# Patient Record
Sex: Female | Born: 1935 | Race: White | Hispanic: No | Smoking: Never smoker
Health system: Southern US, Community
[De-identification: ages and names within clinical notes are randomized; demographics above are authoritative.]

## PROBLEM LIST (undated history)

## (undated) DIAGNOSIS — I10 Essential (primary) hypertension: Secondary | ICD-10-CM

## (undated) DIAGNOSIS — J189 Pneumonia, unspecified organism: Secondary | ICD-10-CM

## (undated) HISTORY — PX: REPLACEMENT TOTAL KNEE: SUR1224

## (undated) HISTORY — PX: VAGINAL HYSTERECTOMY: SUR661

## (undated) HISTORY — DX: Essential (primary) hypertension: I10

## (undated) HISTORY — DX: Pneumonia, unspecified organism: J18.9

---

## 2005-03-22 ENCOUNTER — Ambulatory Visit: Payer: Self-pay | Admitting: Family Medicine

## 2018-10-10 ENCOUNTER — Institutional Professional Consult (permissible substitution): Payer: Self-pay | Admitting: Pulmonary Disease

## 2018-10-18 ENCOUNTER — Encounter: Payer: Self-pay | Admitting: Pulmonary Disease

## 2018-10-18 ENCOUNTER — Ambulatory Visit (INDEPENDENT_AMBULATORY_CARE_PROVIDER_SITE_OTHER): Payer: Medicare Other | Admitting: Pulmonary Disease

## 2018-10-18 ENCOUNTER — Other Ambulatory Visit: Payer: Self-pay

## 2018-10-18 VITALS — BP 136/84 | HR 81 | Ht 64.0 in | Wt 149.8 lb

## 2018-10-18 DIAGNOSIS — R911 Solitary pulmonary nodule: Secondary | ICD-10-CM | POA: Diagnosis not present

## 2018-10-18 MED ORDER — ALBUTEROL SULFATE HFA 108 (90 BASE) MCG/ACT IN AERS: 2.0000 | INHALATION_SPRAY | Freq: Four times a day (QID) | RESPIRATORY_TRACT | 2 refills | Status: DC | PRN

## 2018-10-18 NOTE — Progress Notes (Signed)
Subjective:   PATIENT ID: Gail [name] GENDER: female DOB: 1932/05/02, MRN: [id number]   HPI  Chief Complaint  Patient presents with  . Consult    lung nodule found when diagnosed with pneumonia Sep 22, 2018    Reason for Visit: New consult for lung nodule  Mr. Gail [name] is a 85 year old female never smoker with HTN, stage IV CKD, paroxysmal atrial fibrillation, B12 deficiency who presents as new consult for lung nodule.  A few weeks ago she presented to Urgent care, she was diagnosed with pneumonia after having symptoms of weakness, dizziness and shortness of breath. CXR showed pneumonia. Follow-up CT scan was obtained and demonstrated incidental lung nodule. She does not have any imaging or reports with her. She does not know which side the nodule is. Aside from her recent illness, which she has recovered from she denies any symptoms of fevers, chills, weight loss, night sweats, chest pain.   She has chronic nonproductive cough at night. Reports shortness of breath improved after treatment with antibiotics. She denies lower extremity swelling.  Of note, she is planned for cataract surgery and would like to know if this lung nodule will postpone her procedure.  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   I have personally reviewed patient's past medical/family/social history, allergies, current medications.  Past Medical History:  Diagnosis Date  . Hypertension   . Pneumonia      Family History  Problem Relation Age of Onset  . Kidney cancer Mother   . Cancer Father   . Cancer Sister   . Colon cancer Sister   . Cancer Sister      Social History   Occupational History  . Occupation: retired     Comment: Radiation protection practitioner work   Tobacco Use  . Smoking status: Never Smoker  . Smokeless tobacco: Never Used  Substance and Sexual Activity  . Alcohol use: Never    Frequency:  Never  . Drug use: Never  . Sexual activity: Not on file    Allergies  Allergen Reactions  . Codeine     Other reaction(s): GI Upset (intolerance)     Outpatient Medications Prior to Visit  Medication Sig Dispense Refill  . amLODipine (NORVASC) 10 MG tablet TAKE 1 TABLET(10 MG) BY MOUTH DAILY    . Multiple Vitamins-Minerals (MULTIVITAMIN ADULT PO) Take by mouth.     No facility-administered medications prior to visit.     Review of Systems  Constitutional: Positive for malaise/fatigue. Negative for chills, diaphoresis, fever and weight loss.  HENT: Negative for congestion, ear pain and sore throat.   Respiratory: Positive for shortness of breath. Negative for cough, hemoptysis, sputum production and wheezing.   Cardiovascular: Negative for chest pain, palpitations and leg swelling.  Gastrointestinal: Negative for abdominal pain, heartburn and nausea.  Genitourinary: Negative for frequency.  Musculoskeletal: Positive for joint pain. Negative for myalgias.  Skin: Negative for itching and rash.  Neurological: Positive for dizziness and weakness. Negative for headaches.  Endo/Heme/Allergies: Does not bruise/bleed easily.  Psychiatric/Behavioral: Negative for depression. The patient is not nervous/anxious.      Objective:   Vitals:   10/18/18 1620 10/18/18 1622  BP: 136/84   Pulse:  81  SpO2:  92%  Weight: 149 lb 12.8 oz (67.9 kg)   Height: 5\' 4"  (1.626 m)    SpO2: 92 %  Physical Exam: General: Thin, elderly-appearing female, no acute distress  HENT: [place], AT, OP clear, MMM Eyes: EOMI, no scleral icterus Respiratory: Clear to auscultation bilaterally.  No crackles, wheezing or rales Cardiovascular: RRR, -M/R/G, no JVD GI: BS+, soft, nontender Extremities:-Edema,-tenderness Neuro: AAO x4, CNII-XII grossly intact Skin: Intact, no rashes or bruising Psych: Normal mood, normal affect  Data Reviewed:  Imaging: None on file or CareEverywhere  PFT: None on file   Labs: OSH Labs [facility] 09/22/18 Na 139 K 3.6 Cl 100 CO2 30 BUN 29 Glucose 116 Ca 8.6 T Protein 6.2 T. Bili 0.4 AP 113 AST 62 ALT 38  TTE 06/04/24  EF 36%, diastolic dysfunction, severe TR with RVSP 63 mmHg  Imaging, labs and tests noted above have been reviewed independently by me.    Assessment & Plan:  1. Lung nodule  Discussion: 85 year old female never smoker with HTN, stage IV CKD, paroxysmal atrial fibrillation, B12 deficiency who presents as new consult for lung nodule. No imaging available at time of visit to review so unknown the size or character of the nodule. Will arrange for repeat CT scan and request of prior imaging to compare. Her respiratory symptoms are mild and do not limit her daily activity. Echo noted to have elevated RVSP, otherwise no evidence of right heart failure. Will trial SABA to see if this improves. Will discuss additional work-up of her dyspnea at future visit.  OK to proceed cataract surgery  Will plan for CT chest without contrast in 3 months Will obtain Randolf CT scan. If earlier intervention is needed, we will contact you Will prescribe albuterol inhaler to use as needed for shortness of breath or wheezing   Health Maintenance Prevnar 12/22/1998 Influenza 03/09/18  Orders Placed This Encounter  Procedures  . CT Chest Wo Contrast    Standing Status:   Future    Standing Expiration Date:   12/18/2019    Scheduling Instructions:     Please schedule in 3 months    Order Specific Question:   ** REASON FOR EXAM (FREE TEXT)    Answer:   lung nodule    Order Specific Question:   Preferred imaging location?    Answer:   [facility]    Order Specific Question:   Radiology Contrast Protocol - do NOT remove file path    Answer:   [email or web address]   Meds ordered this encounter  Medications  . albuterol (PROVENTIL HFA;VENTOLIN HFA) 108 (90 Base) MCG/ACT inhaler    Sig: Inhale 2 puffs into the lungs every 6  (six) hours as needed for wheezing or shortness of breath.    Dispense:  1 Inhaler    Refill:  2    Return in about 3 months (around 01/18/2019).  [place], MD [facility] 10/27/2018 3:47 PM  Office Number [phone]

## 2018-10-18 NOTE — Patient Instructions (Signed)
OK to proceed cataract surgery  Will plan for CT chest without contrast in 3 months Will obtain Randolf CT scan. If earlier intervention is needed, we will contact you Will prescribe albuterol inhaler to use as needed for shortness of breath or wheezing

## 2018-10-19 ENCOUNTER — Encounter: Payer: Self-pay | Admitting: Pulmonary Disease

## 2018-10-23 ENCOUNTER — Telehealth: Payer: Self-pay | Admitting: Pulmonary Disease

## 2018-10-23 NOTE — Telephone Encounter (Signed)
ROI Faxed to [facility] 3/16/20fbg

## 2018-10-24 NOTE — Telephone Encounter (Signed)
I do not see anything in Ellison's mailbox or on her desk.

## 2018-10-24 NOTE — Telephone Encounter (Signed)
Dr. Loanne Drilling and Langley Gauss, please advise if you have received the disk. Thank you.

## 2018-10-25 NOTE — Telephone Encounter (Signed)
The CT disk is here and I am placing it on Dr. Cordelia Pen desk today.

## 2018-10-27 DIAGNOSIS — R911 Solitary pulmonary nodule: Secondary | ICD-10-CM | POA: Insufficient documentation

## 2018-10-27 DIAGNOSIS — I480 Paroxysmal atrial fibrillation: Secondary | ICD-10-CM | POA: Insufficient documentation

## 2018-10-27 DIAGNOSIS — R918 Other nonspecific abnormal finding of lung field: Secondary | ICD-10-CM | POA: Insufficient documentation

## 2018-10-27 DIAGNOSIS — N184 Chronic kidney disease, stage 4 (severe): Secondary | ICD-10-CM | POA: Insufficient documentation

## 2018-10-27 DIAGNOSIS — I10 Essential (primary) hypertension: Secondary | ICD-10-CM | POA: Insufficient documentation

## 2018-10-27 NOTE — Telephone Encounter (Signed)
Will review on Monday.

## 2018-11-14 ENCOUNTER — Telehealth: Payer: Self-pay | Admitting: Pulmonary Disease

## 2018-11-15 NOTE — Telephone Encounter (Signed)
Please see the information below. Sent to you as a FYI. Once the fax comes in, it should be placed in Dr. Cordelia Pen in box.

## 2018-11-16 NOTE — Telephone Encounter (Signed)
Checked Dr. Loanne Drilling in box up front, this fax has not come through yet.

## 2018-11-20 NOTE — Telephone Encounter (Signed)
No report in Dr. Cordelia Pen box as of 11/20/18 a.m.

## 2018-11-20 NOTE — Telephone Encounter (Signed)
Langley Gauss - please advise if you have this CT report. Thanks!

## 2018-11-22 NOTE — Telephone Encounter (Signed)
Spoke with receptionist at [facility]. She is going to refax this CT report to [place]. Will await fax.

## 2018-11-24 NOTE — Telephone Encounter (Signed)
Langley Gauss, please advise if you have now received the fax from [facility]. Thanks!

## 2018-11-27 NOTE — Telephone Encounter (Signed)
Patient has inquired about CT scan results.  She wanted Dr. Loanne Drilling to review and relay her findings.  Patient has also requested the written report be sent to [place] but no copy of this report has been noted.    Dr. Loanne Drilling can you please advise your interpretation of this patient's scan so we may update her?   Thanks

## 2018-11-27 NOTE — Telephone Encounter (Signed)
Still no fax in Gail [name]'s box but she did pick up the CD of scan a few weeks ago so I will message her on the 10/23/18 note to get a result from her on the scan.  Then we can update the patient of her interpretation.

## 2018-11-30 NOTE — Telephone Encounter (Signed)
Spoke with Dr. Loanne Drilling by phone 11/29/18 and she plans to review scan and give [place] a response for the patient.  A call was made to [facility] to get copy of CT report also since it has not been located.

## 2018-12-01 NOTE — Telephone Encounter (Signed)
Received written report of CT scan. Placed copy on Dr. Cordelia Pen desk for review and a copy was sent to scan file.  Nothing further needed today.

## 2018-12-04 ENCOUNTER — Telehealth: Payer: Self-pay | Admitting: Pulmonary Disease

## 2018-12-04 DIAGNOSIS — R911 Solitary pulmonary nodule: Secondary | ICD-10-CM

## 2018-12-04 NOTE — Telephone Encounter (Signed)
Received a call from Dr. Loanne Drilling wanting to know the report from the CT pt had performed 09/28/2018.  Impression: 1. Multiple bilateral pulmonary nodules suggesting metastatic disease 2. Patchy airspace opacities in the basilar segments of both lower lobes and in the inferior right middle lobe, suspect infectious/inflammatory etiology 3. Coronary and Aortic Atherosclerosis  Per Dr. Loanne Drilling, pt needs to have PET performed to further evaluate the results of the CT. Dr. Loanne Drilling also asked if I could call pt to let her know what was needed to be done and I stated to Dr. Loanne Drilling that I would call pt.  Called and spoke with pt letting her know that Dr. Loanne Drilling tried to upload the images from the CD but she was not able to do so but we did receive the written report from the  CT scan and stated to her based on the written report, Dr. Loanne Drilling was wanting her to have a PET scan performed ASAP to further evaluate the CT. Pt expressed understanding and asked me to contact her daughter Thayer Headings [facility]) due to pt not driving and her daughter would be the one who would be bringing her to get the scan performed.  Attempted to contact pt's daughter Thayer Headings letting her know this information but unable to reach her. Left a detailed message for Thayer Headings and stated in the message for her to return my call. Order has been pended. Will await a return call from [place] and then will place the order.

## 2018-12-04 NOTE — Telephone Encounter (Signed)
Patient's daughter Thayer Headings did return my call and I stated to her the info that I had told pt. Per Thayer Headings, please try to get PET scheduled Tuesday, Wednesday, or Friday as she is off these days. I stated to Thayer Headings that I would place that in the scheduling instructions. Stated to her that our PCCs will be contacting her soon to get PET scheduled for pt. Thayer Headings expressed understanding. Nothing further needed.

## 2018-12-04 NOTE — Telephone Encounter (Signed)
Unable to review scan due to incompatibility of CD on several computers. Fax received on 12/01/18. Report reviewed with staff with radiology impression concerning for "multiple pulmonary nodules suggesting metastatic disease." Though I can not personally review imaging, I believe patient warrants PET asap for further evaluation.

## 2018-12-06 ENCOUNTER — Telehealth: Payer: Self-pay | Admitting: Pulmonary Disease

## 2018-12-06 NOTE — Telephone Encounter (Signed)
Spoke with Judeen Hammans about message:  Returned call to patient's daughter. She would like PET cancelled at [facility] for tomorrow 5/1. She would like it rescheduled in [place]. Explained per Judeen Hammans we are not sure how long it will take to get re- scheduled. They are ok with that as the patient will be more comfortable being closer to home.  Routing to [facility] pool.

## 2018-12-06 NOTE — Telephone Encounter (Signed)
Spoke to [place] and she is going to work on this.

## 2018-12-06 NOTE — Telephone Encounter (Signed)
PET Scan at [facility] has [name] cancelled because the Patient wanted to have the PET scan done at [facility]. I have it scheduled for 12/20/2018 @ 9:45am. Daughter Gail [name] is aware of the appt and location. Referral order has [name] faxed to [facility]

## 2018-12-08 ENCOUNTER — Ambulatory Visit (HOSPITAL_COMMUNITY): Payer: Medicare Other

## 2018-12-18 ENCOUNTER — Telehealth: Payer: Self-pay | Admitting: Pulmonary Disease

## 2018-12-18 NOTE — Telephone Encounter (Signed)
12/18/2018 1518  Contacted by [facility] today to review patient's upcoming order for CT chest, per COVID-19 protocol.  Will cancel current order for CT chest as patient has PET scan scheduled on 12/20/2018.  It appears per chart review that CT chest order was initially placed prior to getting previous CT results.  Previous CT results prompted PET scan order.  Patient proceed forward with PET scan as planned and scheduled on 12/20/2018.  Can consider repeat CT chest after PET scan if clinically indicated.  Will route note to Dr. Loanne Drilling as Juluis Rainier.  Wyn Quaker, FNP

## 2018-12-20 ENCOUNTER — Encounter: Payer: Self-pay | Admitting: Pulmonary Disease

## 2018-12-27 ENCOUNTER — Telehealth: Payer: Self-pay | Admitting: Pulmonary Disease

## 2018-12-27 DIAGNOSIS — R911 Solitary pulmonary nodule: Secondary | ICD-10-CM

## 2018-12-27 NOTE — Telephone Encounter (Signed)
Called spoke with Thayer Headings patient daughter. Per previous encounter it appear Dr. Loanne Drilling attempted to contact patient with results documented she wished to be called back on 5/21. Closing this encounter. Sending message to JE to call patient daughter.

## 2018-12-27 NOTE — Telephone Encounter (Signed)
[facility]  Contacted by staff regarding PET/CT 12/20/18 report received via fax today (12/27/18).  Personally reviewed report (imaging not available):  Findings summarized Largest pulmonary nodules in the left lower lobe deep to the heart measuring 2.7 cm with SUV max equal 3.7. This is relatively mild to moderate activity for lesions this large. Adjacent 8 mm left lobe nodules has faint metabolic activity.  Nodule in the left upper lobe for example measuring 21JH has low metabolic activity SUV max equal 1.25. There are scattered nodules in the right lung with faint or no metabolic activity.  No hypermetabolic mediastinal lympho nodes. Incidental gallstones  Impression: 1. Bilateral pulmonary nodules with relatively low metabolic activity remain indeterminate. The relatively low metabolic activity is not typical of bronchogenic carcinoma or pulmonary metastasis. Largest nodule in the LEFT lower lobe would be difficult to biopsy. Some of the smaller nodules may be assessable for tissue sampling. 2. No mediastinal lymphadenopathy. No evidence of distant metastatic disease. 3. Focal uptake at the ileocecal valve is favored physiologic   I contacted patient regarding test results. Before discussion began, she requested to be called tomorrow due to needing to use the restroom.  Plan to call patient on 5/21.  Rodman Pickle, M.D. [facility] Pulmonary/Critical Care Medicine Pager: [phone] After hours pager: [phone]

## 2018-12-28 NOTE — Telephone Encounter (Signed)
[facility] Telephone Encounter  I contacted [name] and her daughter, Gail [name], via telephone.  We reviewed PET/CT report and discussed the differential diagnosis of her pulmonary nodules including infection, inflammation and malignancy. My suspicion for malignancy is low however I cannot rule it out. We discussed the risks and benefits of continued surveillance vs invasive testing. We also discussed if patient would want to be aggressive with treatment if this did turn out to be malignancy. After shared decision making, [name] wished to remain conservative and continue ongoing surveillance.   Assessment: Bilateral pulmonary nodules PET-avid lung nodules, low metabolic activity  CT Chest without contrast ordered for November 2020 Will arrange for televisit in November 2020 after imaging obtained  Rodman Pickle, M.D. [facility]monary/Critical Care Medicine Pager: [phone] After hours pager: [phone]

## 2018-12-28 NOTE — Addendum Note (Signed)
Addended by: Rodman Pickle on: 12/28/2018 04:48 PM   Modules accepted: Orders

## 2019-01-17 ENCOUNTER — Telehealth: Payer: Self-pay

## 2019-01-17 NOTE — Telephone Encounter (Signed)
Left message with daughter Thayer Headings to see if they needed the televisit tomorrow.  Dr Loanne Drilling in her last note stated to f/u in November with a CT scan.  This f/u was scheduled in March 2020.  JE has spoken with them after this date.  Left it up to pt.  Stated they are more than welcome to have the televisit but wanted to give them the option.

## 2019-01-18 ENCOUNTER — Ambulatory Visit: Payer: Medicare Other | Admitting: Pulmonary Disease

## 2019-01-18 ENCOUNTER — Ambulatory Visit: Payer: Medicare Other | Admitting: Primary Care

## 2019-01-18 ENCOUNTER — Other Ambulatory Visit: Payer: Self-pay

## 2019-01-18 NOTE — Progress Notes (Signed)
Canceled.

## 2019-06-06 ENCOUNTER — Other Ambulatory Visit: Payer: Self-pay | Admitting: *Deleted

## 2019-06-06 ENCOUNTER — Telehealth: Payer: Self-pay | Admitting: *Deleted

## 2019-06-06 DIAGNOSIS — R911 Solitary pulmonary nodule: Secondary | ICD-10-CM

## 2019-06-06 NOTE — Telephone Encounter (Signed)
Called patient made her aware we cancel appointment for CT Chest WO due to insurance. Patient ask to call daughter I called daughter and left message for her to call back. [phone]. Sent message to office CT appt was CX due to insurance will need to be rescheduled at different facility and needs new order.

## 2019-06-13 ENCOUNTER — Ambulatory Visit
Admission: RE | Admit: 2019-06-13 | Discharge: 2019-06-13 | Disposition: A | Discharge status: Home / Self Care | Payer: Medicare Other | Source: Ambulatory Visit | Attending: Pulmonary Disease | Admitting: Pulmonary Disease

## 2019-06-13 ENCOUNTER — Inpatient Hospital Stay: Admission: RE | Admit: 2019-06-13 | Payer: Medicare Other | Source: Ambulatory Visit

## 2019-06-13 ENCOUNTER — Other Ambulatory Visit: Payer: Self-pay

## 2019-06-13 DIAGNOSIS — R911 Solitary pulmonary nodule: Secondary | ICD-10-CM

## 2019-06-13 IMAGING — CT CT CHEST W/O CM
1 series · 14 of 34 positions shown, 18 images · non-contrast
Comparison: PET-CT [DATE].  Chest CT [DATE].

CLINICAL DATA: 87-year-old female with multiple pulmonary nodules
ranging from 8-29 millimeters which were indeterminate by PET-CT in
[REDACTED] Subsequent encounter.

EXAM:
CT CHEST WITHOUT CONTRAST
TECHNIQUE: Multidetector CT imaging of the chest was performed following the
standard protocol without IV contrast.

[Series 2: chest w/(date) · axial · 0.57mm/px · z∈[-248,-12]mm · 14 of 140 slices shown, 18 images]
[im 11/140  mediastinal]
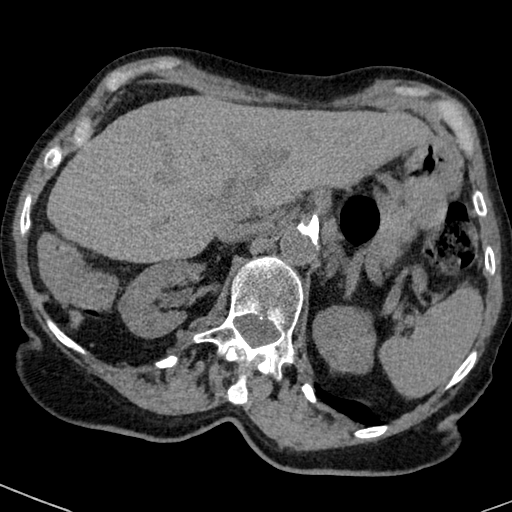
[im 11/140  lung]
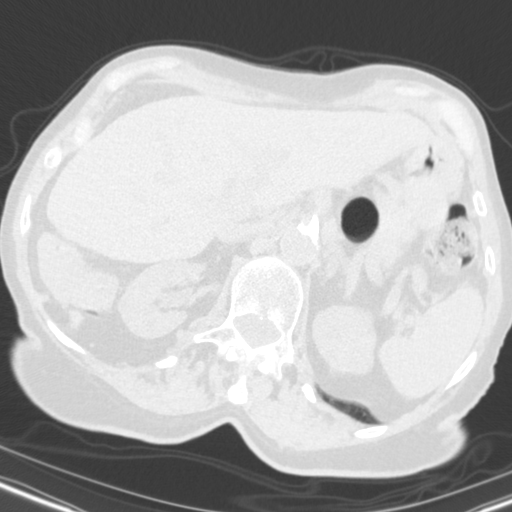
[im 21/140  lung]
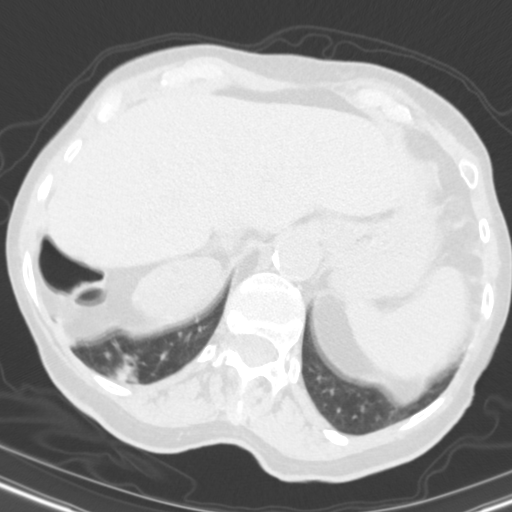
[im 28/140  lung]
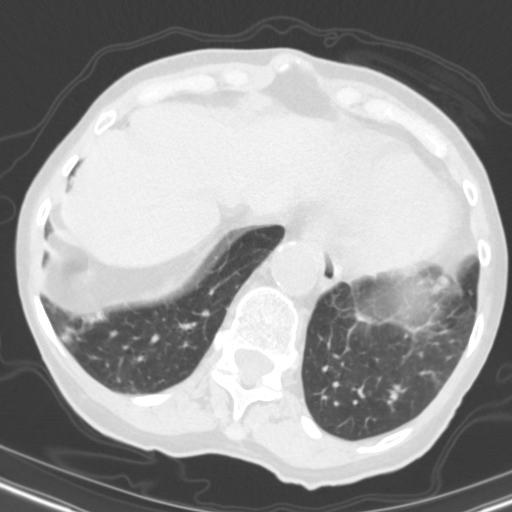
[im 42/140  lung]
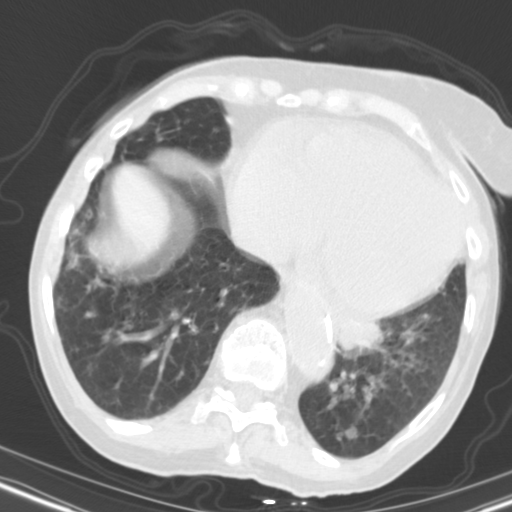
[im 52/140  mediastinal]
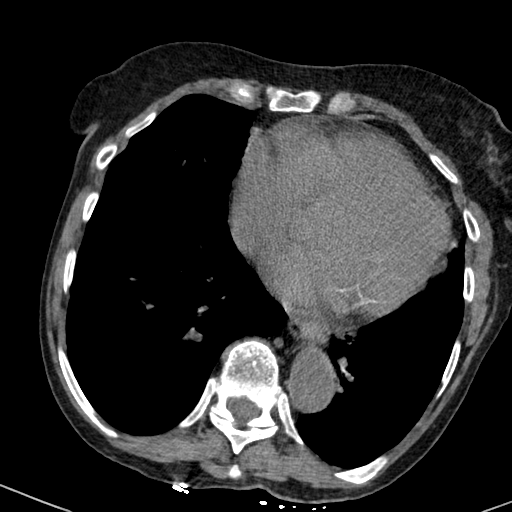
[im 52/140  lung]
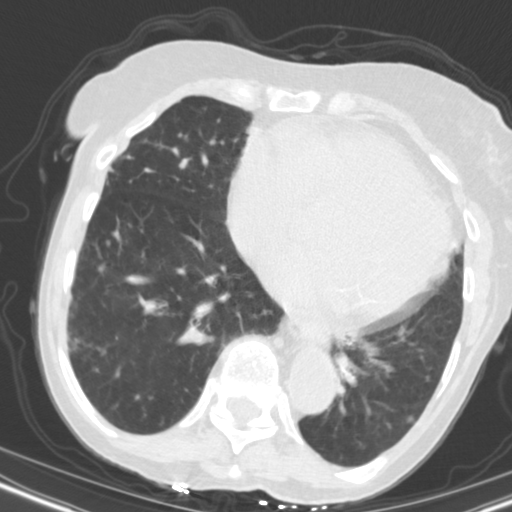
[im 57/140  lung]
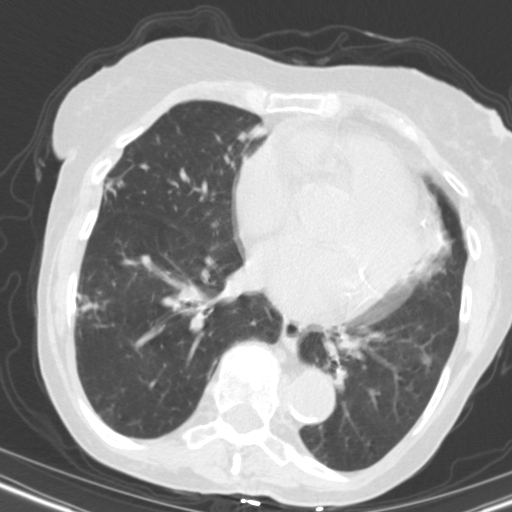
[im 66/140  lung]
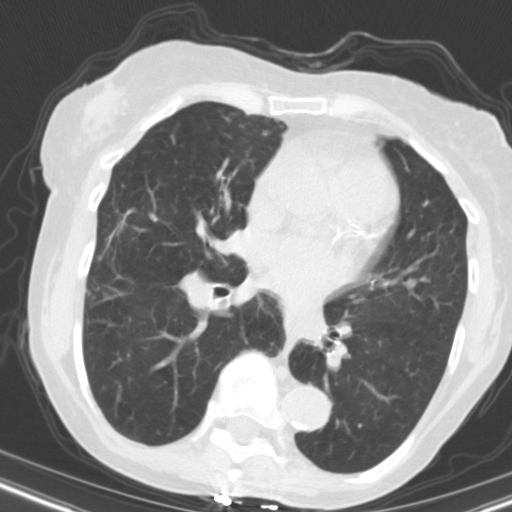
[im 75/140  lung]
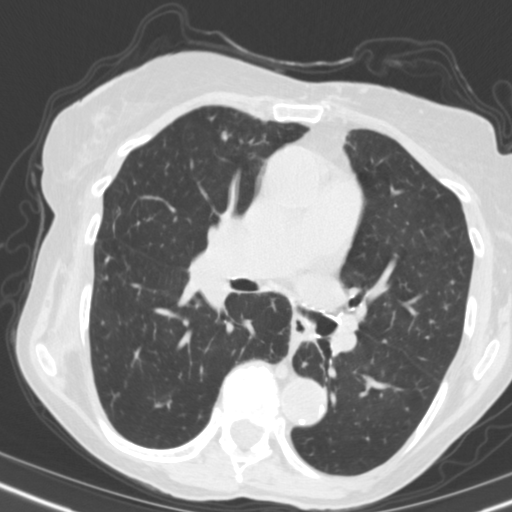
[im 83/140  mediastinal]
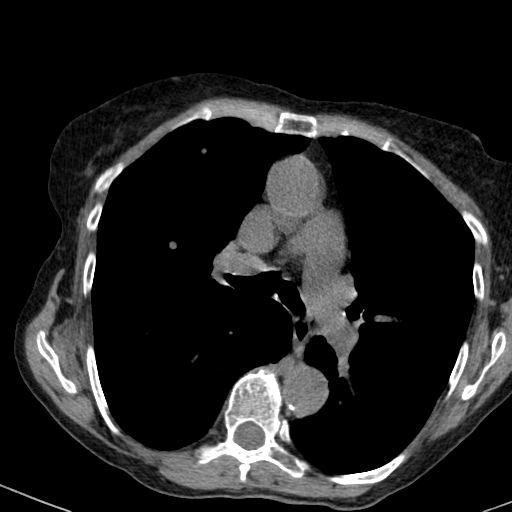
[im 83/140  lung]
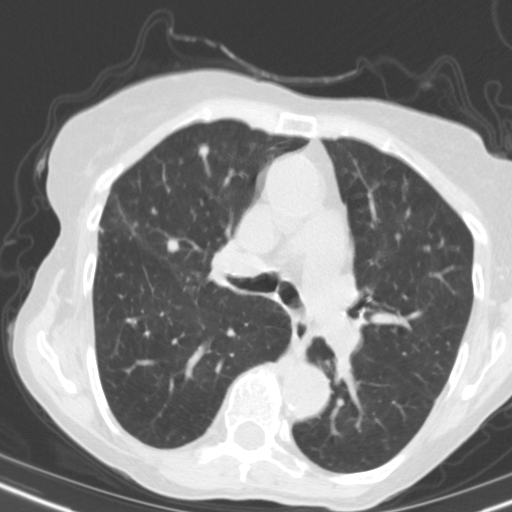
[im 88/140  lung]
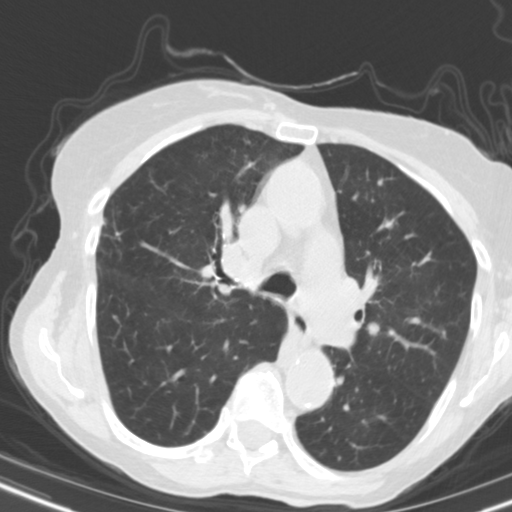
[im 103/140  lung]
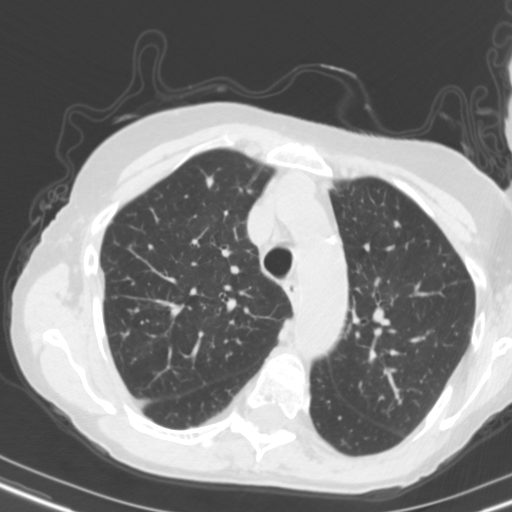
[im 112/140  lung]
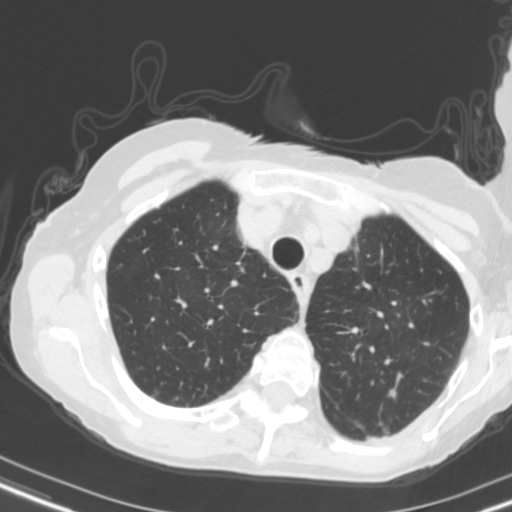
[im 119/140  mediastinal]
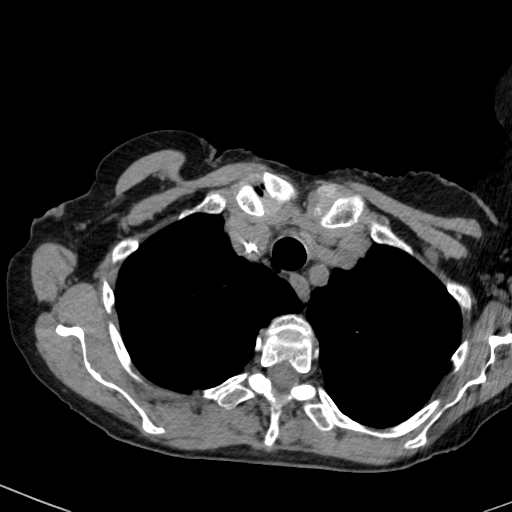
[im 119/140  lung]
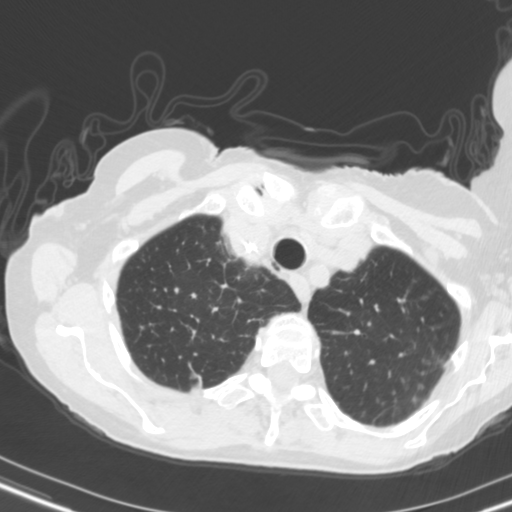
[im 129/140  lung]
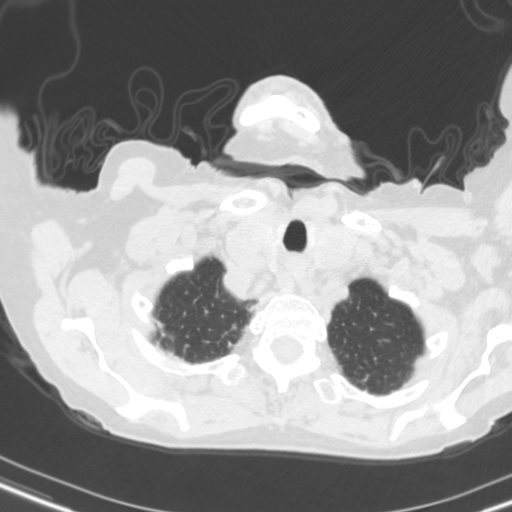

[14 of 34 positions shown; findings below may reference images not displayed]

FINDINGS: Cardiovascular: Stable cardiomegaly. No pericardial effusion.
Tortuous thoracic aorta with calcified atherosclerosis. Vascular
patency is not evaluated in the absence of IV contrast. Calcified
coronary artery atherosclerosis. Tortuous proximal great vessels.

Mediastinum/Nodes: No mediastinal or hilar lymphadenopathy is
evident in the absence of contrast.

Lungs/Pleura: Large lung volumes. The major airways appear patent.
There is respiratory motion artifact at both lung bases today. And
this motion mildly obscures the dominant left anterior basal segment
lower lobe lung nodule which partially abuts the posterior
mediastinum. The size of that nodule appears stable since [DATE] millimeters (series 3, image 105).

There are fairly numerous smaller bilateral round pulmonary nodules,
the smallest are 2 millimeters. Some of these have mildly lobulated
contour (right upper lobe series 3, image 24). Compared to
[DATE], none of these additional nodules appear increased in
size.

The next largest nodule is 10-11 millimeters in the posterior left
upper lobe on series 3, image 46.

In the right posterior costophrenic angle a nodular area which was
17 millimeters in [REDACTED] has partially resolved and appears
inflammatory.

Improved bilateral lower lung ventilation since [REDACTED]. Some
chronic atelectasis or scarring in both middle lobes. Mild residual
inflammatory appearing distal airway opacity in the lateral basal
segment of the right lower lobe on image 84.

No pleural effusion.

Upper Abdomen: Negative visible noncontrast liver, spleen and bowel
in the upper abdomen. Stable visible kidneys.

Musculoskeletal: Stable visualized osseous structures. T11 vertebral
body hemangioma. Severe degenerative changes at both shoulders. No
acute or suspicious osseous lesion identified.
IMPRESSION: 1. All of the bilateral pulmonary nodules seen by CT in [REDACTED] are
stable, including the dominant lesion in the anterior basal segment
of the left lower lobe (about 27 mm).
One area in the right costophrenic angle has partially resolved and
is likely inflammatory. No new or progressive nodule is identified.
Stability favors benign etiology but these remain indeterminate and
continued imaging surveillance is necessary.
2. No new abnormality in the chest. Cardiomegaly. Aortic
Atherosclerosis ([OJ]-[OJ]).

## 2019-06-15 ENCOUNTER — Telehealth: Payer: Self-pay | Admitting: Pulmonary Disease

## 2019-06-15 DIAGNOSIS — R911 Solitary pulmonary nodule: Secondary | ICD-10-CM

## 2019-06-15 NOTE — Telephone Encounter (Signed)
[facility] Telephone Encounter  I contacted [name] and her daughter, Dot Been, via telephone.  Updated patient on stable pulmonary nodules compared to Feb 2020 images. Recommended following nodules for up to 2 years to ensure stability.  Will message staff to arrange CT Chest without contrast in 6 months with follow-up inperson visit to review images. Both expressed understanding and agree to plan.  Assessment Multiple pulmonary nodules  Plan Repeat CT Chest in 12/2018 Follow-up with me in-person to review scan  Rodman Pickle, M.D. [facility] Pulmonary/Critical Care Medicine 06/15/2019 10:34 AM

## 2019-06-15 NOTE — Telephone Encounter (Signed)
6 order for 6 months placed as well as 6 month reminder with ellison

## 2019-06-15 NOTE — Addendum Note (Signed)
Addended by: Rosana Berger on: 06/15/2019 12:21 PM   Modules accepted: Orders

## 2019-09-02 DIAGNOSIS — R7989 Other specified abnormal findings of blood chemistry: Secondary | ICD-10-CM

## 2019-09-02 DIAGNOSIS — I361 Nonrheumatic tricuspid (valve) insufficiency: Secondary | ICD-10-CM

## 2019-09-02 DIAGNOSIS — N390 Urinary tract infection, site not specified: Secondary | ICD-10-CM

## 2019-09-02 DIAGNOSIS — I10 Essential (primary) hypertension: Secondary | ICD-10-CM

## 2019-09-03 DIAGNOSIS — R7989 Other specified abnormal findings of blood chemistry: Secondary | ICD-10-CM | POA: Diagnosis not present

## 2019-09-03 DIAGNOSIS — I10 Essential (primary) hypertension: Secondary | ICD-10-CM | POA: Diagnosis not present

## 2019-09-03 DIAGNOSIS — N390 Urinary tract infection, site not specified: Secondary | ICD-10-CM | POA: Diagnosis not present

## 2019-09-04 DIAGNOSIS — I10 Essential (primary) hypertension: Secondary | ICD-10-CM | POA: Diagnosis not present

## 2019-09-04 DIAGNOSIS — N390 Urinary tract infection, site not specified: Secondary | ICD-10-CM | POA: Diagnosis not present

## 2019-09-04 DIAGNOSIS — R7989 Other specified abnormal findings of blood chemistry: Secondary | ICD-10-CM | POA: Diagnosis not present

## 2019-09-05 DIAGNOSIS — R7989 Other specified abnormal findings of blood chemistry: Secondary | ICD-10-CM | POA: Diagnosis not present

## 2019-09-05 DIAGNOSIS — N390 Urinary tract infection, site not specified: Secondary | ICD-10-CM | POA: Diagnosis not present

## 2019-09-05 DIAGNOSIS — I10 Essential (primary) hypertension: Secondary | ICD-10-CM | POA: Diagnosis not present

## 2019-11-16 ENCOUNTER — Telehealth: Payer: Self-pay | Admitting: Pulmonary Disease

## 2019-11-16 NOTE — Telephone Encounter (Signed)
Judeen Hammans is working on this one she has the order.

## 2019-11-16 NOTE — Telephone Encounter (Signed)
Spoke to pt's dtr and gave her appt info.  Nothing further needed.

## 2019-12-13 ENCOUNTER — Other Ambulatory Visit: Payer: Self-pay

## 2019-12-13 ENCOUNTER — Ambulatory Visit
Admission: RE | Admit: 2019-12-13 | Discharge: 2019-12-13 | Disposition: A | Discharge status: Home / Self Care | Payer: Medicare Other | Source: Ambulatory Visit | Attending: Pulmonary Disease | Admitting: Pulmonary Disease

## 2019-12-13 DIAGNOSIS — R911 Solitary pulmonary nodule: Secondary | ICD-10-CM

## 2019-12-19 ENCOUNTER — Other Ambulatory Visit: Payer: Self-pay

## 2019-12-19 ENCOUNTER — Ambulatory Visit: Payer: Medicare Other | Admitting: Primary Care

## 2019-12-19 ENCOUNTER — Ambulatory Visit (INDEPENDENT_AMBULATORY_CARE_PROVIDER_SITE_OTHER): Payer: Medicare Other | Admitting: Primary Care

## 2019-12-19 DIAGNOSIS — R918 Other nonspecific abnormal finding of lung field: Secondary | ICD-10-CM | POA: Diagnosis not present

## 2019-12-19 NOTE — Progress Notes (Signed)
Virtual Visit via Telephone Note  I connected with Gail [name] on 12/19/19 at  4:30 PM EDT by telephone and verified that I am speaking with the correct person using two identifiers.  Location: Patient: Home Provider: Office   I discussed the limitations, risks, security and privacy concerns of performing an evaluation and management service by telephone and the availability of in person appointments. I also discussed with the patient that there may be a patient responsible charge related to this service. The patient expressed understanding and agreed to proceed.   History of Present Illness: 86 year old female, never smoked. PMH significant for paroxysmal afib, hypertension, CKD stage IV, BILATERAL lung nodules, hypertension. Patient of Dr. Loanne Drilling, seen for initial consult on 10/18/18. She had a PET scan in in May 2020 that showed low metabolic activity. Plan surveillance. CT chest in 3 months.    Previous [facility] pulmonary encounter: Reason for Visit: New consult for lung nodule Mr. Gail [name] is a 86 year old female never smoker with HTN, stage IV CKD, paroxysmal atrial fibrillation, B12 deficiency who presents as new consult for lung nodule.A few weeks ago she presented to Urgent care, she was diagnosed with pneumonia after having symptoms of weakness, dizziness and shortness of breath. CXR showed pneumonia. Follow-up CT scan was obtained and demonstrated incidental lung nodule. She does not have any imaging or reports with her. She does not know which side the nodule is. Aside from her recent illness, which she has recovered from she denies any symptoms of fevers, chills, weight loss, night sweats, chest pain.  She has chronic nonproductive cough at night. Reports shortness of breath improved after treatment with antibiotics. She denies lower extremity swelling. Of note, she is planned for cataract surgery and would like to know if this lung nodule will postpone her procedure.  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  12/19/2019  Patient contacted today for follow-up for pulmonary nodules hx. Accompanied by her daughter on phone called.  She is doing well. She has a dry cough when she lies down at night. Has a good appetite. She has lost some weight. No hemoptysis. No respiratory symptoms. CT chest on 12/13/19 showed stable bilateral pulmonary nodules. There were no new nodules, long term stability favors benign etiology. They do not wish to proceed with agressive diagnostic measures. No further imaging warranted at this time.    Observations/Objective:  - No significant shortness of breath or respiratory symptom  12/13/19 CT CHEST wo contrast Stable bilateral pulmonary nodules. There are no new nodules identified on today's study. Long-term stability favors benign etiology. Stable 2.2 cm hypodense nodule left lobe thyroid. In the setting of significant comorbidities or limited life expectancy, no follow-up recommended. (Ref: J Am Coll Radiol. 2015 Feb;12(2): 143-50). Aortic Atherosclerosis (ICD10-I70.0).  06/13/19 CT CHEST wo contrast:  1. All of the bilateral pulmonary nodules seen by CT in February are stable, including the dominant lesion in the anterior basal segment of the left lower lobe (about 27 mm). One area in the right costophrenic angle has partially resolved and is likely inflammatory. No new or progressive nodule is identified. Stability favors benign etiology but these remain indeterminate and continued imaging surveillance is necessary. 2. No new abnormality in the chest. Cardiomegaly. Aortic Atherosclerosis (ICD10-I70.0).   Assessment and Plan:  Bilateral pulmonary nodules: - Never smoked  - PET scan in in May 2020 that showed low metabolic activity - CT  chest wo contrast May 2021 showed stable bilateral pulmonary nodules. There was no new nodules, long term stability  favors benign etiology - Patient/family do not wish to proceed with agressive diagnostic measures. No further imaging warranted at this time.   Follow Up Instructions:  - Follow up as needed only    I discussed the assessment and treatment plan with the patient. The patient was provided an opportunity to ask questions and all were answered. The patient agreed with the plan and demonstrated an understanding of the instructions.   The patient was advised to call back or seek an in-person evaluation if the symptoms worsen or if the condition fails to improve as anticipated.  I provided 22 minutes of non-face-to-face time during this encounter.   Martyn Ehrich, NP

## 2019-12-23 NOTE — Patient Instructions (Signed)
  Bilateral pulmonary nodules: - Stable bilateral pulmonary nodules. There were no new nodules, long term stability favors benign etiology - Patient/family do not wish to proceed with agressive diagnostic measures. No further imaging warranted at this time.    Follow Up Instructions:  - Follow up as needed only

## 2019-12-24 NOTE — Progress Notes (Signed)
Reviewed and agree with assessment/plan.   Philo Kurtz, MD [facility] 12/24/2019, 10:15 AM Pager:  [phone]  

## 2021-06-12 ENCOUNTER — Encounter: Payer: Self-pay | Admitting: Pulmonary Disease

## 2021-06-12 ENCOUNTER — Ambulatory Visit (INDEPENDENT_AMBULATORY_CARE_PROVIDER_SITE_OTHER): Payer: Medicare Other | Admitting: Pulmonary Disease

## 2021-06-12 ENCOUNTER — Other Ambulatory Visit: Payer: Self-pay

## 2021-06-12 VITALS — BP 142/80 | HR 88 | Temp 98.1°F | Ht 61.0 in | Wt 144.0 lb

## 2021-06-12 DIAGNOSIS — Z23 Encounter for immunization: Secondary | ICD-10-CM | POA: Diagnosis not present

## 2021-06-12 DIAGNOSIS — J42 Unspecified chronic bronchitis: Secondary | ICD-10-CM | POA: Insufficient documentation

## 2021-06-12 DIAGNOSIS — R918 Other nonspecific abnormal finding of lung field: Secondary | ICD-10-CM

## 2021-06-12 MED ORDER — ALBUTEROL SULFATE HFA 108 (90 BASE) MCG/ACT IN AERS: 2.0000 | INHALATION_SPRAY | Freq: Four times a day (QID) | RESPIRATORY_TRACT | 0 refills | Status: DC | PRN

## 2021-06-12 NOTE — Progress Notes (Signed)
Subjective:   PATIENT ID: Gail [name] GENDER: female DOB: May 25, 1932, MRN: [id number]   HPI  Chief Complaint  Patient presents with   Follow-up    Lung nodules   Reason for Visit: Follow-up  Mr. Gail [name] is a 87 year old female never smoker with HTN, stage IV CKD, paroxysmal atrial fibrillation, B12 deficiency who presents for follow-up.   Synopsis: Seen in consult for incidental lung nodule. 2021 CT imaging with stable nodules. Patient/family deferred further imaging  06/12/21 She is coming in for a check-up. She has had a longstanding cough that is overall unproductive. She has chest congestion that will periodically. Sometimes wheezing. Cough does no limit her activity or quality of life. She and her daughter inquire about the previous lung nodules. She was advised to return regarding prior lung nodules. She confirms that she would not want aggressive care and is not interested in further imaging.  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   Past Medical History:  Diagnosis Date   Hypertension    Pneumonia      Allergies  Allergen Reactions   Codeine     Other reaction(s): GI Upset (intolerance)     Outpatient Medications Prior to Visit  Medication Sig Dispense Refill   amLODipine (NORVASC) 10 MG tablet TAKE 1 TABLET(10 MG) BY MOUTH DAILY     cyanocobalamin (,VITAMIN B-12,) 1000 MCG/ML injection INJECT 1 ML INTO THE MUSCLE EVERY 30 DAYS     Multiple Vitamins-Minerals (MULTIVITAMIN ADULT PO) Take by mouth.     albuterol (PROVENTIL HFA;VENTOLIN HFA) 108 (90 Base) MCG/ACT inhaler Inhale 2 puffs into the lungs every 6 (six) hours as needed for wheezing or shortness of breath. (Patient not taking: Reported on 06/12/2021) 1 Inhaler 2   No facility-administered medications prior to visit.    Review of Systems  Constitutional:  Negative for chills, diaphoresis, fever, malaise/fatigue  and weight loss.  HENT:  Negative for congestion.   Respiratory:  Positive for cough. Negative for hemoptysis, sputum production, shortness of breath and wheezing.   Cardiovascular:  Negative for chest pain, palpitations and leg swelling.    Objective:   Vitals:   06/12/21 0928  BP: (!) 142/80  Pulse: 88  Temp: 98.1 F (36.7 C)  TempSrc: Oral  SpO2: 96%  Weight: 144 lb (65.3 kg)  Height: 5\' 1"  (1.549 m)   SpO2: 96 % O2 Device: None (Room air) Physical Exam: General: Frail cachectic-appearing, no acute distress HENT: [place], AT Eyes: EOMI, no scleral icterus Respiratory: Clear to auscultation bilaterally.  No crackles, wheezing or rales Cardiovascular: RRR, -M/R/G, no JVD Extremities:-Edema,-tenderness Neuro: AAO x4, CNII-XII grossly intact Psych: Normal mood, normal affect  Data Reviewed:  Imaging: CT Chest 12/13/19 - LLL nodule 26 x 19 mm, LUL 11 x 8 mm, RLL 16 x 9 mm, stable compared to 09/2018   PFT: None on file  TTE 40/98/11  EF 91%, diastolic dysfunction, severe TR with RVSP 63 mmHg    Assessment & Plan:  Discussion: 87 year old female never smoker with HTN, stage IV CKD, paroxysmal atrial fibrillation, B12 deficiency who presents for follow-up. We discussed cough which she states is not interfering with her quality of life. We reviewed prior CT scans and re-discussed utility of ongoing lung nodule surveillance. Usually we follow nodules for two years to ensure stability. Nodules have been stable from 2020 to 2021. She confirms that she would not want  aggressive care and is not interested in further imaging. I addressed patient's and daughter's questions to the best of my ability regarding lung nodule management.  Multiple lung nodules, suspect benign --No further imaging after discussion that we want avoid aggressive measures  Chronic bronchitis with cough --START albuterol inhaler as needed for wheezing --Take Mucinex for 3-5 days to assist in mucous  production  Health Maintenance Immunization History  Administered Date(s) Administered   Fluad Quad(high Dose 65+) 06/12/2021   Influenza, High Dose Seasonal PF 06/06/2017, 09/19/2018, 09/22/2018, 05/02/2019    Orders Placed This Encounter  Procedures   Flu Vaccine QUAD High Dose(Fluad)   Meds ordered this encounter  Medications   albuterol (VENTOLIN HFA) 108 (90 Base) MCG/ACT inhaler    Sig: Inhale 2 puffs into the lungs every 6 (six) hours as needed for wheezing or shortness of breath.    Dispense:  8 g    Refill:  0    Return if symptoms worsen or fail to improve.  I have spent a total time of 36-minutes on the day of the appointment reviewing prior documentation, coordinating care and discussing medical diagnosis and plan with the patient/family. Past medical history, allergies, medications were reviewed. Pertinent imaging, labs and tests included in this note have been reviewed and interpreted independently by me.  [place], MD [facility] 06/12/2021 3:45 PM  Office Number [phone]

## 2021-06-12 NOTE — Patient Instructions (Addendum)
Multiple lung nodules, suspect benign --No further imaging after discussion that we want avoid aggressive measures  Chronic bronchitis with cough --START albuterol inhaler as needed for wheezing --Take Mucinex for 3-5 days to assist in mucous production  Follow-up with me as needed

## 2021-07-04 ENCOUNTER — Other Ambulatory Visit: Payer: Self-pay | Admitting: Pulmonary Disease
